# Patient Record
Sex: Male | Born: 1955 | Race: White | Hispanic: No | Marital: Married | Smoking: Never smoker
Health system: Southern US, Community
[De-identification: ages and names within clinical notes are randomized; demographics above are authoritative.]

## PROBLEM LIST (undated history)

## (undated) DIAGNOSIS — K219 Gastro-esophageal reflux disease without esophagitis: Secondary | ICD-10-CM

## (undated) DIAGNOSIS — R7303 Prediabetes: Secondary | ICD-10-CM

## (undated) DIAGNOSIS — E785 Hyperlipidemia, unspecified: Secondary | ICD-10-CM

## (undated) HISTORY — PX: TONSILLECTOMY: SUR1361

## (undated) HISTORY — PX: COLONOSCOPY: SHX174

---

## 2004-05-22 ENCOUNTER — Ambulatory Visit: Payer: Self-pay | Admitting: Gastroenterology

## 2006-06-06 ENCOUNTER — Ambulatory Visit: Payer: Self-pay | Admitting: Internal Medicine

## 2006-06-08 ENCOUNTER — Ambulatory Visit: Payer: Self-pay | Admitting: Internal Medicine

## 2006-07-18 ENCOUNTER — Ambulatory Visit: Payer: Self-pay | Admitting: Gastroenterology

## 2008-07-19 ENCOUNTER — Ambulatory Visit: Payer: Self-pay | Admitting: Family Medicine

## 2009-06-28 HISTORY — PX: JOINT REPLACEMENT: SHX530

## 2010-05-25 ENCOUNTER — Ambulatory Visit: Payer: Self-pay | Admitting: Gastroenterology

## 2010-06-01 ENCOUNTER — Ambulatory Visit: Payer: Self-pay | Admitting: Unknown Physician Specialty

## 2010-06-10 ENCOUNTER — Inpatient Hospital Stay: Payer: Self-pay | Admitting: General Practice

## 2013-07-02 ENCOUNTER — Other Ambulatory Visit: Payer: Self-pay | Admitting: Family Medicine

## 2013-07-02 LAB — COMPREHENSIVE METABOLIC PANEL
Albumin: 3.8 g/dL (ref 3.4–5.0)
Alkaline Phosphatase: 84 U/L
Anion Gap: 1 — ABNORMAL LOW (ref 7–16)
BUN: 15 mg/dL (ref 7–18)
Bilirubin,Total: 0.6 mg/dL (ref 0.2–1.0)
Calcium, Total: 9.3 mg/dL (ref 8.5–10.1)
Chloride: 105 mmol/L (ref 98–107)
Co2: 32 mmol/L (ref 21–32)
Creatinine: 1.16 mg/dL (ref 0.60–1.30)
EGFR (African American): >60
EGFR (Non-African Amer.): >60
Glucose: 80 mg/dL (ref 65–99)
Osmolality: 275 (ref 275–301)
Potassium: 4.2 mmol/L (ref 3.5–5.1)
SGOT(AST): 25 U/L (ref 15–37)
SGPT (ALT): 43 U/L (ref 12–78)
Sodium: 138 mmol/L (ref 136–145)
Total Protein: 7.5 g/dL (ref 6.4–8.2)

## 2013-07-10 ENCOUNTER — Ambulatory Visit: Payer: Self-pay | Admitting: Gastroenterology

## 2013-07-12 ENCOUNTER — Ambulatory Visit: Payer: Self-pay | Admitting: Gastroenterology

## 2013-07-13 LAB — PATHOLOGY REPORT

## 2013-07-26 ENCOUNTER — Ambulatory Visit: Payer: Self-pay | Admitting: Gastroenterology

## 2013-08-03 ENCOUNTER — Ambulatory Visit: Payer: Self-pay | Admitting: Gastroenterology

## 2014-08-03 IMAGING — NM NUCLEAR MEDICINE HEPATOHBILIARY INCLUDE GB
2 series · 16 of 16 positions shown · non-contrast
Comparison: Ultrasound 07/10/2013

RADIOPHARMACEUTICALS:  8.29 mCi Pc-WWm Choletec

CLINICAL DATA: Epigastric abdominal pain.

EXAM:
NUCLEAR MEDICINE HEPATOBILIARY IMAGING WITH GALLBLADDER EF
TECHNIQUE: Sequential images of the abdomen were obtained [DATE] minutes
following intravenous administration of radiopharmaceutical. After
slow intravenous infusion of 1.9 micrograms Sincalide, gallbladder
ejection fraction was determined.

[Series 1000: gallbladder ef · 4.80mm/px · 6 of 120 frames shown]
[frame 11/120]
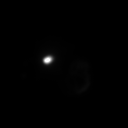
[frame 31/120]
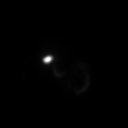
[frame 51/120]
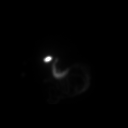
[frame 71/120]
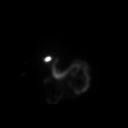
[frame 91/120]
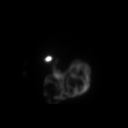
[frame 111/120]
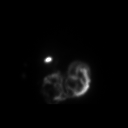

[Series 1000: gallbladder statics · 4.80mm/px · 10 of 10 slices shown]
[im 1/10]
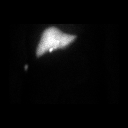
[im 2/10]
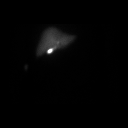
[im 3/10]
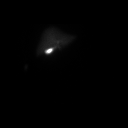
[im 4/10]
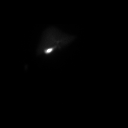
[im 5/10]
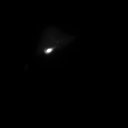
[im 6/10]
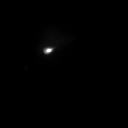
[im 7/10]
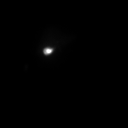
[im 8/10]
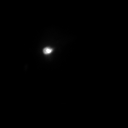
[im 9/10]
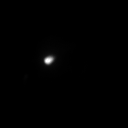
[im 10/10]
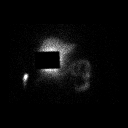

[16 of 16 positions shown; findings below may reference images not displayed]

FINDINGS: Radiopharmaceutical uptake in the liver with excretion into the
biliary system. There is activity in the gallbladder and small
bowel. Calculated ejection fraction is 84%.. Normal ejection
fraction is greater than 35%. Reportedly, the patient had very mild
pain during the procedure.
IMPRESSION: Normal gallbladder ejection fraction. Normal hepatobiliary
examination.

## 2016-04-21 ENCOUNTER — Encounter
Admission: RE | Admit: 2016-04-21 | Discharge: 2016-04-21 | Disposition: A | Discharge status: Home / Self Care | Payer: 59 | Source: Ambulatory Visit | Attending: Surgery | Admitting: Surgery

## 2016-04-21 HISTORY — DX: Gastro-esophageal reflux disease without esophagitis: K21.9

## 2016-04-21 NOTE — Patient Instructions (Signed)
  Your procedure is scheduled on: 04-28-16 Report to Same Day Surgery 2nd floor medical mall To find out your arrival time please call [phone] between 1PM - 3PM on 04-29-16  Remember: Instructions that are not followed completely may result in serious medical risk, up to and including death, or upon the discretion of your surgeon and anesthesiologist your surgery may need to be rescheduled.    _x___ 1. Do not eat food or drink liquids after midnight. No gum chewing or hard candies.     __x__ 2. No Alcohol for 24 hours before or after surgery.   __x__3. No Smoking for 24 prior to surgery.   ____  4. Bring all medications with you on the day of surgery if instructed.    __x__ 5. Notify your doctor if there is any change in your medical condition     (cold, fever, infections).     Do not wear jewelry, make-up, hairpins, clips or nail polish.  Do not wear lotions, powders, or perfumes. You may wear deodorant.  Do not shave 48 hours prior to surgery. Men may shave face and neck.  Do not bring valuables to the hospital.    [facility] is not [facility] for any belongings or valuables.               Contacts, dentures or bridgework may not be worn into surgery.  Leave your suitcase in the car. After surgery it may be brought to your room.  For patients admitted to the hospital, discharge time is determined by your treatment team.   Patients discharged the day of surgery will not be allowed to drive home.    Please read over the following fact sheets that you were given:   [facility] Preparing for [facility] and or MRSA Information   ____ Take these medicines the morning of surgery with A SIP OF WATER:    1. NONE  2.  3.  4.  5.  6.  ____Fleets enema or Magnesium Citrate as directed.   ____ Use CHG Soap or sage wipes as directed on instruction sheet   ____ Use inhalers on the day of surgery and bring to hospital day of surgery  ____ Stop metformin 2 days prior to  surgery    ____ Take 1/2 of usual insulin dose the night before surgery and none on the morning of           surgery.   ____ Stop aspirin or coumadin, or plavix  x__ Stop Anti-inflammatories such as Advil, Aleve, Ibuprofen, Motrin, Naproxen,          Naprosyn, Goodies powders or aspirin products NOW-Ok to take Tylenol.   ____ Stop supplements until after surgery.    ____ Bring C-Pap to the hospital.

## 2016-04-28 MED ORDER — CEFAZOLIN SODIUM-DEXTROSE 2-4 GM/100ML-% IV SOLN
2.0000 g | Freq: Once | INTRAVENOUS | Status: AC
  Administered 2016-04-29: 2 g via INTRAVENOUS

## 2016-04-29 ENCOUNTER — Ambulatory Visit
Admission: RE | Admit: 2016-04-29 | Discharge: 2016-04-29 | Disposition: A | Discharge status: Home / Self Care | Payer: 59 | Source: Ambulatory Visit | Attending: Surgery | Admitting: Surgery

## 2016-04-29 ENCOUNTER — Encounter: Payer: Self-pay | Admitting: *Deleted

## 2016-04-29 ENCOUNTER — Ambulatory Visit: Payer: 59 | Admitting: Anesthesiology

## 2016-04-29 ENCOUNTER — Encounter
Admission: RE | Disposition: A | Discharge status: Home / Self Care | Payer: Self-pay | Source: Ambulatory Visit | Attending: Surgery

## 2016-04-29 DIAGNOSIS — K219 Gastro-esophageal reflux disease without esophagitis: Secondary | ICD-10-CM | POA: Diagnosis not present

## 2016-04-29 DIAGNOSIS — E669 Obesity, unspecified: Secondary | ICD-10-CM | POA: Diagnosis not present

## 2016-04-29 DIAGNOSIS — Z6829 Body mass index (BMI) 29.0-29.9, adult: Secondary | ICD-10-CM | POA: Insufficient documentation

## 2016-04-29 DIAGNOSIS — K429 Umbilical hernia without obstruction or gangrene: Secondary | ICD-10-CM | POA: Diagnosis present

## 2016-04-29 HISTORY — PX: UMBILICAL HERNIA REPAIR: SHX196

## 2016-04-29 SURGERY — REPAIR, HERNIA, UMBILICAL, ADULT
Anesthesia: General | Site: Abdomen | Wound class: Clean

## 2016-04-29 MED ORDER — BUPIVACAINE-EPINEPHRINE 0.5% -1:200000 IJ SOLN
INTRAMUSCULAR | Status: DC | PRN
  Administered 2016-04-29: 5 mL

## 2016-04-29 MED ORDER — OXYCODONE HCL 5 MG/5ML PO SOLN: 5.0000 mg | Freq: Once | ORAL | Status: DC | PRN

## 2016-04-29 MED ORDER — LIDOCAINE HCL (CARDIAC) 20 MG/ML IV SOLN
INTRAVENOUS | Status: DC | PRN
  Administered 2016-04-29: 100 mg via INTRAVENOUS

## 2016-04-29 MED ORDER — FAMOTIDINE 20 MG PO TABS
ORAL_TABLET | ORAL | Status: AC
  Filled 2016-04-29: qty 1

## 2016-04-29 MED ORDER — DEXAMETHASONE SODIUM PHOSPHATE 10 MG/ML IJ SOLN
INTRAMUSCULAR | Status: DC | PRN
  Administered 2016-04-29: 10 mg via INTRAVENOUS

## 2016-04-29 MED ORDER — BUPIVACAINE-EPINEPHRINE (PF) 0.5% -1:200000 IJ SOLN
INTRAMUSCULAR | Status: AC
  Filled 2016-04-29: qty 30

## 2016-04-29 MED ORDER — OXYCODONE HCL 5 MG PO TABS: 5.0000 mg | ORAL_TABLET | Freq: Once | ORAL | Status: DC | PRN

## 2016-04-29 MED ORDER — MIDAZOLAM HCL 2 MG/2ML IJ SOLN
INTRAMUSCULAR | Status: DC | PRN
  Administered 2016-04-29: 2 mg via INTRAVENOUS

## 2016-04-29 MED ORDER — FENTANYL CITRATE (PF) 100 MCG/2ML IJ SOLN
INTRAMUSCULAR | Status: AC
  Filled 2016-04-29: qty 2

## 2016-04-29 MED ORDER — HYDROCODONE-ACETAMINOPHEN 5-325 MG PO TABS
ORAL_TABLET | ORAL | Status: AC
  Filled 2016-04-29: qty 1

## 2016-04-29 MED ORDER — CEFAZOLIN SODIUM-DEXTROSE 2-4 GM/100ML-% IV SOLN
INTRAVENOUS | Status: AC
  Filled 2016-04-29: qty 100

## 2016-04-29 MED ORDER — FAMOTIDINE 20 MG PO TABS
20.0000 mg | ORAL_TABLET | Freq: Once | ORAL | Status: AC
  Administered 2016-04-29: 20 mg via ORAL

## 2016-04-29 MED ORDER — ONDANSETRON HCL 4 MG/2ML IJ SOLN
INTRAMUSCULAR | Status: DC | PRN
  Administered 2016-04-29: 4 mg via INTRAVENOUS

## 2016-04-29 MED ORDER — FENTANYL CITRATE (PF) 100 MCG/2ML IJ SOLN
INTRAMUSCULAR | Status: DC | PRN
  Administered 2016-04-29 (×2): 50 ug via INTRAVENOUS

## 2016-04-29 MED ORDER — PROMETHAZINE HCL 25 MG/ML IJ SOLN: 6.2500 mg | INTRAMUSCULAR | Status: DC | PRN

## 2016-04-29 MED ORDER — PROPOFOL 10 MG/ML IV BOLUS
INTRAVENOUS | Status: DC | PRN
  Administered 2016-04-29: 150 mg via INTRAVENOUS

## 2016-04-29 MED ORDER — LACTATED RINGERS IV SOLN
INTRAVENOUS | Status: DC
  Administered 2016-04-29: 08:00:00 via INTRAVENOUS

## 2016-04-29 MED ORDER — MEPERIDINE HCL 25 MG/ML IJ SOLN: 6.2500 mg | INTRAMUSCULAR | Status: DC | PRN

## 2016-04-29 MED ORDER — FENTANYL CITRATE (PF) 100 MCG/2ML IJ SOLN
25.0000 ug | INTRAMUSCULAR | Status: DC | PRN
  Administered 2016-04-29 (×4): 25 ug via INTRAVENOUS

## 2016-04-29 MED ORDER — HYDROCODONE-ACETAMINOPHEN 5-325 MG PO TABS: 1.0000 | ORAL_TABLET | ORAL | 0 refills | Status: AC | PRN

## 2016-04-29 MED ORDER — HYDROCODONE-ACETAMINOPHEN 5-325 MG PO TABS
1.0000 | ORAL_TABLET | ORAL | Status: DC | PRN
  Administered 2016-04-29: 1 via ORAL

## 2016-04-29 SURGICAL SUPPLY — 32 items
BARD SOFT MESH IMPLANT
BLADE SURG 15 STRL LF DISP TIS (BLADE) ×1 IMPLANT
BLADE SURG 15 STRL SS (BLADE) ×2
CANISTER SUCT 1200ML W/VALVE (MISCELLANEOUS) ×3 IMPLANT
CHLORAPREP W/TINT 26ML (MISCELLANEOUS) ×3 IMPLANT
DERMABOND ADVANCED (GAUZE/BANDAGES/DRESSINGS) ×2
DERMABOND ADVANCED .7 DNX12 (GAUZE/BANDAGES/DRESSINGS) ×1 IMPLANT
DRAPE LAPAROTOMY 77X122 PED (DRAPES) ×3 IMPLANT
ELECT REM PT RETURN 9FT ADLT (ELECTROSURGICAL) ×3
ELECTRODE REM PT RTRN 9FT ADLT (ELECTROSURGICAL) ×1 IMPLANT
GLOVE BIO SURGEON STRL SZ7.5 (GLOVE) ×3 IMPLANT
GLOVE BIOGEL M 8.0 STRL (GLOVE) ×6 IMPLANT
GLOVE BIOGEL M STER SZ 6 (GLOVE) ×6 IMPLANT
GOWN STRL REUS W/ TWL LRG LVL3 (GOWN DISPOSABLE) ×3 IMPLANT
GOWN STRL REUS W/TWL LRG LVL3 (GOWN DISPOSABLE) ×6
KIT RM TURNOVER STRD PROC AR (KITS) ×3 IMPLANT
LABEL OR SOLS (LABEL) ×3 IMPLANT
LIQUID BAND (GAUZE/BANDAGES/DRESSINGS) ×3 IMPLANT
MESH SYNTHETIC 4X6 SOFT BARD (Mesh General) ×1 IMPLANT
MESH SYNTHETIC SOFT BARD 4X6 (Mesh General) ×2 IMPLANT
NEEDLE HYPO 25X1 1.5 SAFETY (NEEDLE) ×3 IMPLANT
NS IRRIG 500ML POUR BTL (IV SOLUTION) ×3 IMPLANT
PACK BASIN MINOR ARMC (MISCELLANEOUS) ×3 IMPLANT
SUT CHROMIC 3 0 SH 27 (SUTURE) IMPLANT
SUT CHROMIC 4 0 RB 1X27 (SUTURE) ×3 IMPLANT
SUT MNCRL 4-0 (SUTURE) ×2
SUT MNCRL 4-018XMFL (SUTURE) ×1
SUT MNCRL+ 5-0 UNDYED PC-3 (SUTURE) IMPLANT
SUT MONOCRYL 5-0 (SUTURE)
SUT SURGILON 0 30 BLK (SUTURE) ×3 IMPLANT
SUTURE MNCRL 4-018XMF (SUTURE) ×1 IMPLANT
SYRINGE 10CC LL (SYRINGE) ×3 IMPLANT

## 2016-04-29 NOTE — Transfer of Care (Signed)
Immediate Anesthesia Transfer of Care Note  Patient: Grant Chambers  Procedure(s) Performed: Procedure(s): HERNIA REPAIR UMBILICAL ADULT (N/A)  Patient Location: PACU  Anesthesia Type:General  Level of Consciousness: sedated  Airway & Oxygen Therapy: Patient connected to face mask oxygen  Post-op Assessment: Post -op Vital signs reviewed and stable  Post vital signs: stable  Last Vitals:  Vitals:   04/29/16 0725 04/29/16 1001  BP: (!) 155/84 131/79  Pulse: (!) 58   Resp: 16 16  Temp: 36.2 C 36.7 C    Last Pain:  Vitals:   04/29/16 0725  TempSrc: Oral         Complications: No apparent anesthesia complications

## 2016-04-29 NOTE — OR Nursing (Signed)
Called into OR to let Dr. Katrinka BlazingSmith know that patient is doing well, has received pain medication, is alert and anxious to go home.  Was he willing to let patient go home prior to Dr. Katrinka BlazingSmith seeing him??  Dr. Katrinka BlazingSmith agreed to allow patient to go home.

## 2016-04-29 NOTE — Anesthesia Procedure Notes (Signed)
Procedure Name: LMA Insertion Date/Time: 04/29/2016 8:57 AM Performed by: Irving BurtonBACHICH, Yehudit Fulginiti Pre-anesthesia Checklist: Patient identified, Emergency Drugs available, Suction available and Patient being monitored Patient Re-evaluated:Patient Re-evaluated prior to inductionOxygen Delivery Method: Circle system utilized Preoxygenation: Pre-oxygenation with 100% oxygen Intubation Type: IV induction Ventilation: Mask ventilation without difficulty LMA Size: 4.5 Number of attempts: 1 Placement Confirmation: positive ETCO2 and breath sounds checked- equal and bilateral Tube secured with: Tape Dental Injury: Teeth and Oropharynx as per pre-operative assessment

## 2016-04-29 NOTE — Op Note (Signed)
OPERATIVE REPORT  PREOPERATIVE  DIAGNOSIS: Umbilical hernia  POSTOPERATIVE DIAGNOSIS: Umbilical hernia  PROCEDURE: Umbilical hernia repair  ANESTHESIA: General  SURGEON: Renda RollsWilton Smith M.D.  INDICATIONS:  He reports recent episodes of pain at the navel associated with a palpable bulge. An umbilical hernia was demonstrated on physical exam and repair is recommended for definitive treatment.  The patient was placed on the operating table in the supine position under general anesthesia. The abdomen was prepared with ChloraPrep and draped in a sterile manner. A transversely oriented  curvilinear incision was made and carried down through subcutaneous tissues. The umbilical hernia sac was dissected away from the skin and dissected away from the fascial ring defect. The sac was inverted. Properitoneal fat was dissected away from the fascial ring defect.  Bard soft mesh was cut to create an oval shape of 1.2 x 1.8 cm. It was placed into the properitoneal plane oriented transversely and sutured to the overlying fascia with through and through 0 Surgilon sutures.  The fascial defect was closed with a transversely oriented suture line of interrupted 0 Surgilon figure-of-eight sutures incorporating each suture into the mesh. The skin of the umbilicus was sutured to the subcutaneous tissues with 3-0 chromic suture. The subcutaneous tissues were infiltrated with half percent Sensorcaine with epinephrine. The skin was closed with running 5-0 Monocryl subcuticular suture and Dermabond.  The patient appeared to be in satisfactory condition and was prepared for transfer to the recovery room  Renda RollsWilton Smith M.D.

## 2016-04-29 NOTE — Discharge Instructions (Addendum)
AMBULATORY SURGERY  DISCHARGE INSTRUCTIONS   1) The drugs that you were given will stay in your system until tomorrow so for the next 24 hours you should not:  A) Drive an automobile B) Make any legal decisions C) Drink any alcoholic beverage   2) You may resume regular meals tomorrow.  Today it is better to start with liquids and gradually work up to solid foods.  You may eat anything you prefer, but it is better to start with liquids, then soup and crackers, and gradually work up to solid foods.   3) Please notify your doctor immediately if you have any unusual bleeding, trouble breathing, redness and pain at the surgery site, drainage, fever, or pain not relieved by medication. 4)   5) Your post-operative visit with Dr.                                     is: Date:                        Time:    Please call to schedule your post-operative visit.  6) Additional Instructions:      Take Tylenol or Norco if needed for pain.  May also take ibuprofen as needed.  May shower.  Avoid straining and heavy lifting.

## 2016-04-29 NOTE — H&P (Signed)
  He reports no change in condition since office visit.  Labs noted.  Discussed plan for umbilical hernia repair.

## 2016-04-29 NOTE — Anesthesia Preprocedure Evaluation (Signed)
Anesthesia Evaluation  Patient identified by MRN, date of birth, ID band Patient awake    Reviewed: Allergy & Precautions, NPO status , Patient's Chart, lab work & pertinent test results  History of Anesthesia Complications Negative for: history of anesthetic complications  Airway Mallampati: II  TM Distance: >3 FB Neck ROM: Full    Dental no notable dental hx.    Pulmonary neg pulmonary ROS, neg sleep apnea, neg COPD,    breath sounds clear to auscultation- rhonchi (-) wheezing      Cardiovascular Exercise Tolerance: Good (-) hypertension(-) CAD and (-) Past MI  Rhythm:Regular Rate:Normal - Systolic murmurs and - Diastolic murmurs    Neuro/Psych negative neurological ROS  negative psych ROS   GI/Hepatic Neg liver ROS, GERD  ,  Endo/Other  negative endocrine ROSneg diabetes  Renal/GU negative Renal ROS     Musculoskeletal   Abdominal (+) - obese,   Peds  Hematology negative hematology ROS (+)   Anesthesia Other Findings Past Medical History: No date: GERD (gastroesophageal reflux disease)     Comment: RARE-NO MEDS   Reproductive/Obstetrics                             Anesthesia Physical Anesthesia Plan  ASA: II  Anesthesia Plan: General   Post-op Pain Management:    Induction: Intravenous  Airway Management Planned: LMA  Additional Equipment:   Intra-op Plan:   Post-operative Plan:   Informed Consent: I have reviewed the patients History and Physical, chart, labs and discussed the procedure including the risks, benefits and alternatives for the proposed anesthesia with the patient or authorized representative who has indicated his/her understanding and acceptance.   Dental advisory given  Plan Discussed with: CRNA and Anesthesiologist  Anesthesia Plan Comments:         Anesthesia Quick Evaluation

## 2016-04-29 NOTE — Anesthesia Postprocedure Evaluation (Signed)
Anesthesia Post Note  Patient: Ethel RanaDonald K Derrington  Procedure(s) Performed: Procedure(s) (LRB): HERNIA REPAIR UMBILICAL ADULT (N/A)  Patient location during evaluation: PACU Anesthesia Type: General Level of consciousness: awake and alert and oriented Pain management: pain level controlled Vital Signs Assessment: post-procedure vital signs reviewed and stable Respiratory status: spontaneous breathing, nonlabored ventilation and respiratory function stable Cardiovascular status: blood pressure returned to baseline and stable Postop Assessment: no signs of nausea or vomiting Anesthetic complications: no    Last Vitals:  Vitals:   04/29/16 0725 04/29/16 1001  BP: (!) 155/84 131/79  Pulse: (!) 58   Resp: 16 16  Temp: 36.2 C 36.7 C    Last Pain:  Vitals:   04/29/16 0725  TempSrc: Oral                 Nicanor Mendolia

## 2016-07-01 ENCOUNTER — Encounter: Payer: Self-pay | Admitting: Surgery

## 2016-10-29 ENCOUNTER — Encounter: Payer: Self-pay | Admitting: *Deleted

## 2016-11-01 ENCOUNTER — Encounter
Admission: RE | Disposition: A | Discharge status: Home / Self Care | Payer: Self-pay | Source: Ambulatory Visit | Attending: Unknown Physician Specialty

## 2016-11-01 ENCOUNTER — Ambulatory Visit: Payer: 59 | Admitting: Certified Registered Nurse Anesthetist

## 2016-11-01 ENCOUNTER — Encounter: Payer: Self-pay | Admitting: *Deleted

## 2016-11-01 ENCOUNTER — Ambulatory Visit
Admission: RE | Admit: 2016-11-01 | Discharge: 2016-11-01 | Disposition: A | Discharge status: Home / Self Care | Payer: 59 | Source: Ambulatory Visit | Attending: Unknown Physician Specialty | Admitting: Unknown Physician Specialty

## 2016-11-01 DIAGNOSIS — Z6830 Body mass index (BMI) 30.0-30.9, adult: Secondary | ICD-10-CM | POA: Insufficient documentation

## 2016-11-01 DIAGNOSIS — E669 Obesity, unspecified: Secondary | ICD-10-CM | POA: Insufficient documentation

## 2016-11-01 DIAGNOSIS — Z1211 Encounter for screening for malignant neoplasm of colon: Secondary | ICD-10-CM | POA: Insufficient documentation

## 2016-11-01 DIAGNOSIS — K5730 Diverticulosis of large intestine without perforation or abscess without bleeding: Secondary | ICD-10-CM | POA: Diagnosis not present

## 2016-11-01 DIAGNOSIS — K219 Gastro-esophageal reflux disease without esophagitis: Secondary | ICD-10-CM | POA: Insufficient documentation

## 2016-11-01 DIAGNOSIS — R7303 Prediabetes: Secondary | ICD-10-CM | POA: Insufficient documentation

## 2016-11-01 DIAGNOSIS — Z79899 Other long term (current) drug therapy: Secondary | ICD-10-CM | POA: Insufficient documentation

## 2016-11-01 DIAGNOSIS — K640 First degree hemorrhoids: Secondary | ICD-10-CM | POA: Insufficient documentation

## 2016-11-01 HISTORY — DX: Hyperlipidemia, unspecified: E78.5

## 2016-11-01 HISTORY — DX: Prediabetes: R73.03

## 2016-11-01 HISTORY — PX: COLONOSCOPY WITH PROPOFOL: SHX5780

## 2016-11-01 SURGERY — COLONOSCOPY WITH PROPOFOL
Anesthesia: General

## 2016-11-01 MED ORDER — PROPOFOL 500 MG/50ML IV EMUL
INTRAVENOUS | Status: AC
  Filled 2016-11-01: qty 50

## 2016-11-01 MED ORDER — PIPERACILLIN-TAZOBACTAM 3.375 G IVPB 30 MIN
3.3750 g | Freq: Once | INTRAVENOUS | Status: AC
  Administered 2016-11-01: 3.375 g via INTRAVENOUS
  Filled 2016-11-01: qty 50

## 2016-11-01 MED ORDER — PROPOFOL 500 MG/50ML IV EMUL
INTRAVENOUS | Status: DC | PRN
  Administered 2016-11-01: 140 ug/kg/min via INTRAVENOUS

## 2016-11-01 MED ORDER — LIDOCAINE HCL (CARDIAC) 20 MG/ML IV SOLN
INTRAVENOUS | Status: DC | PRN
  Administered 2016-11-01: 40 mg via INTRAVENOUS

## 2016-11-01 MED ORDER — PROPOFOL 10 MG/ML IV BOLUS
INTRAVENOUS | Status: DC | PRN
  Administered 2016-11-01: 80 mg via INTRAVENOUS

## 2016-11-01 MED ORDER — SODIUM CHLORIDE 0.9 % IV SOLN: INTRAVENOUS | Status: DC

## 2016-11-01 MED ORDER — SODIUM CHLORIDE 0.9 % IV SOLN
INTRAVENOUS | Status: DC
  Administered 2016-11-01: 1 mL via INTRAVENOUS

## 2016-11-01 MED ORDER — LIDOCAINE HCL 2 % IJ SOLN
INTRAMUSCULAR | Status: AC
Start: 2016-11-01 — End: 2016-11-01
  Filled 2016-11-01: qty 10

## 2016-11-01 NOTE — Transfer of Care (Signed)
Immediate Anesthesia Transfer of Care Note  Patient: Grant Chambers  Procedure(s) Performed: Procedure(s): COLONOSCOPY WITH PROPOFOL (N/A)  Patient Location: PACU  Anesthesia Type:General  Level of Consciousness: sedated  Airway & Oxygen Therapy: Patient Spontanous Breathing and Patient connected to nasal cannula oxygen  Post-op Assessment: Report given to RN and Post -op Vital signs reviewed and stable  Post vital signs: Reviewed and stable  Last Vitals:  Vitals:   11/01/16 1310  BP: (!) 141/90  Pulse: 61  Resp: 16    Last Pain:  Vitals:   11/01/16 1310  TempSrc: Tympanic  PainSc: 3       Patients Stated Pain Goal: 0 (11/01/16 1310)  Complications: No apparent anesthesia complications

## 2016-11-01 NOTE — Anesthesia Preprocedure Evaluation (Signed)
Anesthesia Evaluation  Patient identified by MRN, date of birth, ID band Patient awake    Reviewed: Allergy & Precautions, NPO status , Patient's Chart, lab work & pertinent test results  Airway Mallampati: II       Dental  (+) Teeth Intact   Pulmonary neg pulmonary ROS,           Cardiovascular Exercise Tolerance: Good  Rhythm:Regular     Neuro/Psych negative neurological ROS     GI/Hepatic Neg liver ROS, GERD  Medicated,  Endo/Other  negative endocrine ROS  Renal/GU negative Renal ROS     Musculoskeletal negative musculoskeletal ROS (+)   Abdominal (+) + obese,   Peds negative pediatric ROS (+)  Hematology negative hematology ROS (+)   Anesthesia Other Findings   Reproductive/Obstetrics                             Anesthesia Physical Anesthesia Plan  ASA: II  Anesthesia Plan: General   Post-op Pain Management:    Induction: Intravenous  Airway Management Planned: Natural Airway and Nasal Cannula  Additional Equipment:   Intra-op Plan:   Post-operative Plan:   Informed Consent: I have reviewed the patients History and Physical, chart, labs and discussed the procedure including the risks, benefits and alternatives for the proposed anesthesia with the patient or authorized representative who has indicated his/her understanding and acceptance.     Plan Discussed with: CRNA  Anesthesia Plan Comments:         Anesthesia Quick Evaluation

## 2016-11-01 NOTE — Op Note (Signed)
[facility]enter[facility] Patient Name: Grant DenisDonald Sicard Procedure Date: 11/01/2016 1:11 PM MRN: [id number] Account #: [id number] Date of Birth: Sep 16, 1955 Admit Type: Outpatient Age: 61 [facility] ROOM 1 Gender: Male Note Status: Finalized Procedure:            Colonoscopy Indications:          Screening for colorectal malignant neoplasm Providers:            Scot Junobert T. Elliott, MD Referring MD:         Marisue IvanKanhka Linthavong (Referring MD) Medicines:            Propofol per Anesthesia Complications:        No immediate complications. Procedure:            Pre-Anesthesia Assessment:                       - After reviewing the risks and benefits, the patient                        was deemed in satisfactory condition to undergo the                        procedure.                       After obtaining informed consent, the colonoscope was                        passed under direct vision. Throughout the procedure,                        the patient's blood pressure, pulse, and oxygen                        saturations were monitored continuously. The                        Colonoscope was introduced through the anus and                        advanced to the the cecum, identified by appendiceal                        orifice and ileocecal valve. The colonoscopy was                        performed without difficulty. The patient tolerated the                        procedure well. The quality of the bowel preparation                        was good. Findings:      Internal hemorrhoids were found during endoscopy. The hemorrhoids were       small and Grade I (internal hemorrhoids that do not prolapse).      Many small-mouthed diverticula were found in the sigmoid colon and       descending colon.      The exam was otherwise without abnormality. Impression:           - Internal hemorrhoids.                       -  Diverticulosis in the sigmoid colon and in the                         descending colon.                       - The examination was otherwise normal.                       - No specimens collected. Recommendation:       - Repeat colonoscopy in 10 years for screening purposes. Scot Jun, MD 11/01/2016 1:56:34 PM This report has been signed electronically. Number of Addenda: 0 Note Initiated On: 11/01/2016 1:11 PM Scope Withdrawal Time: 0 hours 6 minutes 18 seconds  Total Procedure Duration: 0 hours 8 minutes 15 seconds       [facility]

## 2016-11-01 NOTE — H&P (Signed)
   Primary Care Physician:  Marisue IvanLinthavong, Kanhka, MD Primary Gastroenterologist:  Dr. Mechele CollinElliott  Pre-Procedure History & Physical: HPI:  Grant Chambers is a 61 y.o. male is here for an colonoscopy.   Past Medical History:  Diagnosis Date  . Elevated lipids   . GERD (gastroesophageal reflux disease)    RARE-NO MEDS  . Pre-diabetes     Past Surgical History:  Procedure Laterality Date  . COLONOSCOPY    . JOINT REPLACEMENT Left 2011   KNEE  . TONSILLECTOMY     AGE 61  . UMBILICAL HERNIA REPAIR N/A 04/29/2016   Procedure: HERNIA REPAIR UMBILICAL ADULT;  Surgeon: Nadeen LandauJarvis Wilton Smith, MD;  Location: [facility] ORS;  Service: General;  Laterality: N/A;    Prior to Admission medications   Medication Sig Start Date End Date Taking? Authorizing Provider  acetaminophen (TYLENOL) 500 MG tablet Take 500 mg by mouth every 6 (six) hours as needed.   Yes [provider]  fluticasone (FLONASE) 50 MCG/ACT nasal spray Place 1-2 sprays into both nostrils daily as needed for allergies or rhinitis.   Yes [provider]  HYDROcodone-acetaminophen (NORCO) 5-325 MG tablet Take 1-2 tablets by mouth every 4 (four) hours as needed for moderate pain. 04/29/16  Yes Nadeen LandauSmith, Jarvis Wilton, MD  ibuprofen (ADVIL,MOTRIN) 200 MG tablet Take 800 mg by mouth every 6 (six) hours as needed for headache or moderate pain.   Yes [provider]    Allergies as of 09/01/2016  . (No Known Allergies)    History reviewed. No pertinent family history.  Social History   Social History  . Marital status: Married    Spouse name: N/A  . Number of children: N/A  . Years of education: N/A   Occupational History  . Not on file.   Social History Main Topics  . Smoking status: Never Smoker  . Smokeless tobacco: Never Used  . Alcohol use Yes     Comment: RARE  . Drug use: No  . Sexual activity: Not on file   Other Topics Concern  . Not on file   Social History Narrative  . No narrative on file     Review of Systems: See HPI, otherwise negative ROS  Physical Exam: BP (!) 141/90   Pulse 61   Temp 97.2 F (36.2 C) (Tympanic)   Resp 16   Ht 5\' 7"  (1.702 m)   Wt 87.1 kg (192 lb)   SpO2 99%   BMI 30.07 kg/m  General:   Alert,  pleasant and cooperative in NAD Head:  Normocephalic and atraumatic. Neck:  Supple; no masses or thyromegaly. Lungs:  Clear throughout to auscultation.    Heart:  Regular rate and rhythm. Abdomen:  Soft, nontender and nondistended. Normal bowel sounds, without guarding, and without rebound.   Neurologic:  Alert and  oriented x4;  grossly normal neurologically.  Impression/Plan: Grant RanaDonald K Streck is here for an colonoscopy to be performed for screening colonoscopy  Risks, benefits, limitations, and alternatives regarding  colonoscopy have been reviewed with the patient.  Questions have been answered.  All parties agreeable.   Lynnae PrudeELLIOTT, Braylen Denunzio, MD  11/01/2016, 1:32 PM

## 2016-11-01 NOTE — Anesthesia Post-op Follow-up Note (Cosign Needed)
Anesthesia QCDR form completed.        

## 2016-11-02 ENCOUNTER — Encounter: Payer: Self-pay | Admitting: Unknown Physician Specialty

## 2016-11-03 NOTE — Anesthesia Postprocedure Evaluation (Signed)
Anesthesia Post Note  Patient: Grant Chambers  Procedure(s) Performed: Procedure(s) (LRB): COLONOSCOPY WITH PROPOFOL (N/A)  Patient location during evaluation: PACU Anesthesia Type: General Level of consciousness: awake Pain management: pain level controlled Vital Signs Assessment: post-procedure vital signs reviewed and stable Respiratory status: spontaneous breathing Cardiovascular status: stable Anesthetic complications: no     Last Vitals:  Vitals:   11/01/16 1406 11/01/16 1430  BP: 114/73 134/81  Pulse: (!) 51   Resp: 13   Temp:      Last Pain:  Vitals:   11/01/16 1356  TempSrc: Tympanic  PainSc:                  VAN STAVEREN,[place] Giammona

## 2020-02-27 ENCOUNTER — Other Ambulatory Visit: Payer: Self-pay | Admitting: Orthopedic Surgery

## 2020-02-27 ENCOUNTER — Other Ambulatory Visit (HOSPITAL_COMMUNITY): Payer: Self-pay | Admitting: Orthopedic Surgery

## 2020-02-27 DIAGNOSIS — M25512 Pain in left shoulder: Secondary | ICD-10-CM

## 2020-07-08 ENCOUNTER — Other Ambulatory Visit: Payer: Self-pay

## 2020-07-08 ENCOUNTER — Ambulatory Visit
Admission: RE | Admit: 2020-07-08 | Discharge: 2020-07-08 | Disposition: A | Discharge status: Home / Self Care | Payer: Managed Care, Other (non HMO) | Source: Ambulatory Visit | Attending: Orthopedic Surgery | Admitting: Orthopedic Surgery

## 2020-07-08 DIAGNOSIS — M25512 Pain in left shoulder: Secondary | ICD-10-CM | POA: Diagnosis present

## 2020-08-01 ENCOUNTER — Ambulatory Visit: Admit: 2020-08-01 | Payer: Managed Care, Other (non HMO) | Admitting: Orthopedic Surgery

## 2020-08-01 SURGERY — SHOULDER ARTHROSCOPY WITH SUBACROMIAL DECOMPRESSION AND OPEN ROTATOR CUFF REPAIR, OPEN BICEPS TENDON REPAIR
Anesthesia: Choice | Site: Shoulder | Laterality: Left
